# Patient Record
Sex: Male | Born: 1965 | Race: White | Hispanic: No | Marital: Single | Smoking: Never smoker
Health system: Southern US, Community
[De-identification: ages and names within clinical notes are randomized; demographics above are authoritative.]

## PROBLEM LIST (undated history)

## (undated) DIAGNOSIS — E7800 Pure hypercholesterolemia, unspecified: Secondary | ICD-10-CM

## (undated) DIAGNOSIS — K259 Gastric ulcer, unspecified as acute or chronic, without hemorrhage or perforation: Secondary | ICD-10-CM

## (undated) DIAGNOSIS — T39395A Adverse effect of other nonsteroidal anti-inflammatory drugs [NSAID], initial encounter: Secondary | ICD-10-CM

## (undated) DIAGNOSIS — R112 Nausea with vomiting, unspecified: Secondary | ICD-10-CM

## (undated) DIAGNOSIS — Z9889 Other specified postprocedural states: Secondary | ICD-10-CM

## (undated) HISTORY — PX: BACK SURGERY: SHX140

## (undated) HISTORY — PX: HERNIA REPAIR: SHX51

---

## 2006-04-10 HISTORY — PX: MICRODISCECTOMY LUMBAR: SUR864

## 2006-09-24 ENCOUNTER — Ambulatory Visit (HOSPITAL_COMMUNITY): Admission: RE | Admit: 2006-09-24 | Discharge: 2006-09-24 | Payer: Self-pay | Admitting: Neurosurgery

## 2008-08-04 IMAGING — CR DG LUMBAR SPINE 2-3V
1 series · 1 of 1 positions shown · non-contrast
Comparison: none

CLINICAL DATA: L4-5 microdiskectomy.
 LUMBAR SPINE ? 2 VIEWS:
 Lateral view of the lumbar spine ? 09/24/06.

[view not recorded]
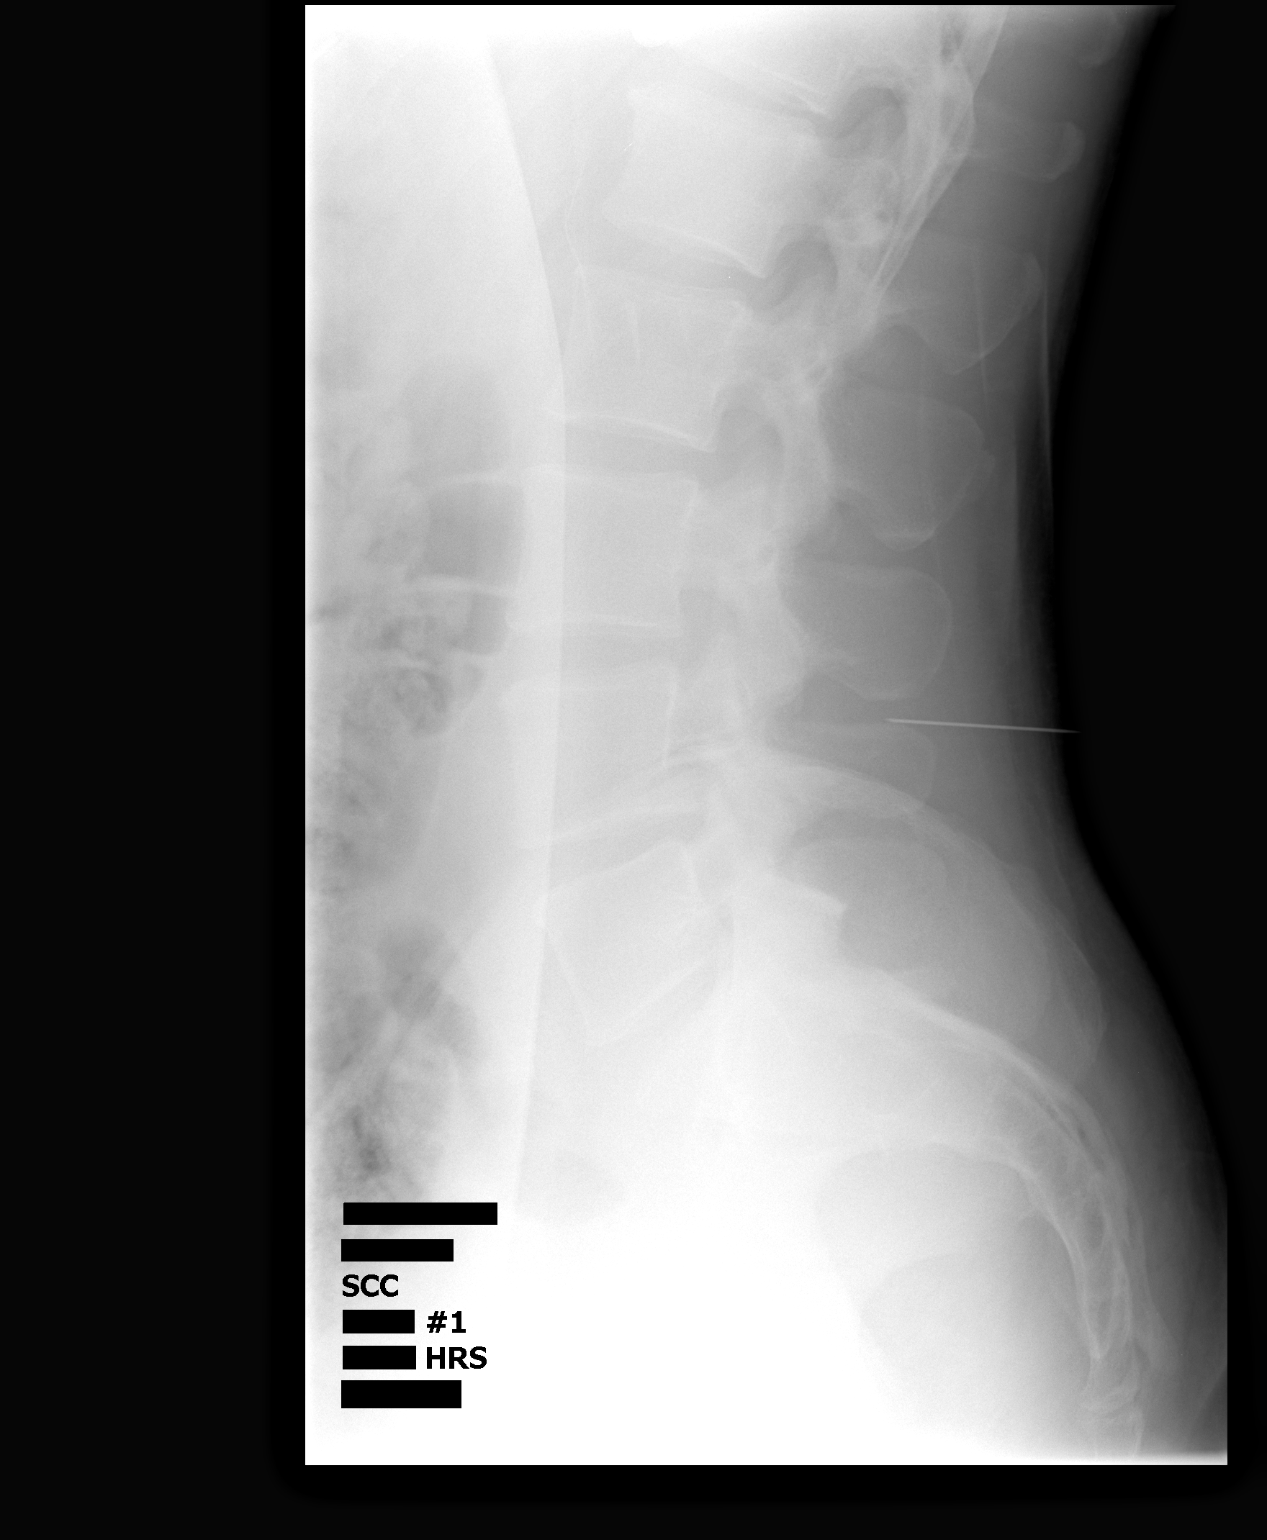

[1 of 1 positions shown; findings below may reference images not displayed]

FINDINGS: Single lateral views of the lumbar spine are provided.  Film labeled #1 at 2265 hours demonstrates a metallic probe at the level of the L4 pedicles.  Film labeled #2 at 9956 hours shows tissue spreaders in place with a blunt metallic probe directed towards the L4-5 level.
IMPRESSION: As discussed above.

## 2009-04-10 HISTORY — PX: EXCISIONAL HEMORRHOIDECTOMY: SHX1541

## 2010-08-23 NOTE — Op Note (Signed)
NAME:  Grant Duffy, Grant Duffy             ACCOUNT NO.:  [id number]   MEDICAL RECORD NO.:  [id number]          PATIENT TYPE:  AMB   LOCATION:  SDS                          FACILITY:  [facility]   PHYSICIAN:  Henry A. Pool, M.D.    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   PROCEDURE NOTE:  Left L4-5 laminotomy and microdiskectomy.   SURGEON:  Kathaleen Maser. Pool, M.D.   ASSISTANT:  Donalee Citrin, M.D.   ANESTHESIA:  General oral endotracheal.   INDICATIONS:  Mr. Weckerly is a 45 year old male with history of back  and left lower extremity pain failing conservative management, workup  demonstrates evidence of a large paracentral disk herniation off to the  left side at L4-5 with moderately severe spinal stenosis and compression  of thecal sac and L5 nerve roots bilaterally, left greater than right.  The patient counseled as to options.  Decided proceed with a left-sided  L4-5 laminotomy microdiskectomy in hopes improving symptoms.   OPERATIVE NOTE:  The patient was taken to the operating room placed on  table supine position.  Adequate level anesthesia achieved, the patient  placed prone on Wilson frame, appropriately padded.  Patient lumbar  regions prepped and draped sterilely.  10 blade used to make linear skin  incision overlying the L4-5 interspace.  This carried sharply in the  midline.  Subperiosteal dissection performed, exposing lamina and facet  joints at L4 and L5 on the left side.  Deep self-retaining retractor was  placed.  Intraoperative x-rays taken, level was confirmed.  Laminotomy  then performed using high-speed drill and Kerrison rongeurs remove  inferior aspect lamina of L4, medial aspect of L4-5 facet joint,  superior rim of  L5-1.  Ligamentum flavum elevated resected piecemeal  fashion using Kerrison rongeurs.  Underlying thecal sac and exiting L5  nerve root were identified.  A microscope brought to the field for  microdissection of the right side L5 nerve root underlying disk  herniation.  Epidural venous plexus coagulated cut.  Thecal sac and  nerve root were mobilized tracked towards midline.  Disk space was then  incised 15 blade in rectangular fashion.  A wide space clean-out was  achieved using pituitary rongeurs, upward angled pituitary rongeurs and  Epstein curettes.  All elements disk herniation completely resected.  All loose or obviously degenerative disk material removed from  interspace.  At this point a very thorough diskectomy was achieved.  All  elements of the disk herniation completely resected.  There is no injury  to thecal sac or nerve roots.  There is no residual compression upon  thecal sac or nerve roots.  Wound was then irrigated with antibiotic  solution.  Gelfoam was placed topically for hemostasis, found be good.  Microscope retractor system were removed.  Hemostasis  muscle achieved with electrocautery. Wound was then closed in layers  Vicryl suture.  Steri-Strips sterile  dressing were applied.  There no  complications.  The patient tolerated procedure well and he returns  recovery postoperatively.           ______________________________  Kathaleen Maser Pool, M.D.     HAP/MEDQ  D:  09/24/2006  T:  09/24/2006  Job:  [id number]

## 2011-01-26 LAB — CBC
Hemoglobin: 16
MCHC: 34.3
Platelets: 238
RDW: 13.2

## 2011-01-26 LAB — DIFFERENTIAL
Basophils Absolute: 0
Basophils Relative: 1
Lymphocytes Relative: 28
Monocytes Absolute: 0.6
Neutro Abs: 4.6
Neutrophils Relative %: 56

## 2011-01-26 LAB — TYPE AND SCREEN
ABO/RH(D): O POS
Antibody Screen: NEGATIVE

## 2011-01-26 LAB — ABO/RH: ABO/RH(D): O POS

## 2011-04-11 DIAGNOSIS — K259 Gastric ulcer, unspecified as acute or chronic, without hemorrhage or perforation: Secondary | ICD-10-CM

## 2011-04-11 HISTORY — PX: UMBILICAL HERNIA REPAIR: SHX196

## 2011-04-11 HISTORY — DX: Gastric ulcer, unspecified as acute or chronic, without hemorrhage or perforation: K25.9

## 2013-07-28 DIAGNOSIS — Z8719 Personal history of other diseases of the digestive system: Secondary | ICD-10-CM | POA: Insufficient documentation

## 2013-07-28 DIAGNOSIS — K219 Gastro-esophageal reflux disease without esophagitis: Secondary | ICD-10-CM | POA: Insufficient documentation

## 2013-07-28 DIAGNOSIS — Z9889 Other specified postprocedural states: Secondary | ICD-10-CM

## 2015-03-09 ENCOUNTER — Encounter (HOSPITAL_COMMUNITY): Payer: Self-pay

## 2015-03-09 ENCOUNTER — Other Ambulatory Visit: Payer: Self-pay | Admitting: General Surgery

## 2015-03-09 ENCOUNTER — Encounter (HOSPITAL_COMMUNITY)
Admission: RE | Admit: 2015-03-09 | Discharge: 2015-03-09 | Disposition: A | Discharge status: Home / Self Care | Payer: BLUE CROSS/BLUE SHIELD | Source: Ambulatory Visit | Attending: General Surgery | Admitting: General Surgery

## 2015-03-09 DIAGNOSIS — Z01812 Encounter for preprocedural laboratory examination: Secondary | ICD-10-CM | POA: Insufficient documentation

## 2015-03-09 DIAGNOSIS — K432 Incisional hernia without obstruction or gangrene: Secondary | ICD-10-CM | POA: Diagnosis not present

## 2015-03-09 HISTORY — DX: Other specified postprocedural states: Z98.890

## 2015-03-09 HISTORY — DX: Nausea with vomiting, unspecified: R11.2

## 2015-03-09 LAB — BASIC METABOLIC PANEL
Anion gap: 9 (ref 5–15)
BUN: 16 mg/dL (ref 6–20)
CALCIUM: 9.7 mg/dL (ref 8.9–10.3)
CO2: 23 mmol/L (ref 22–32)
CREATININE: 1.04 mg/dL (ref 0.61–1.24)
Chloride: 106 mmol/L (ref 101–111)
GFR calc non Af Amer: >60 mL/min (ref >60)
Glucose, Bld: 89 mg/dL (ref 65–99)
Potassium: 4 mmol/L (ref 3.5–5.1)
SODIUM: 138 mmol/L (ref 135–145)

## 2015-03-09 LAB — CBC WITH DIFFERENTIAL/PLATELET
BASOS PCT: 1 %
Basophils Absolute: 0.1 10*3/uL (ref 0.0–0.1)
EOS ABS: 0.1 10*3/uL (ref 0.0–0.7)
EOS PCT: 1 %
HCT: 46.9 % (ref 39.0–52.0)
HEMOGLOBIN: 17 g/dL (ref 13.0–17.0)
Lymphocytes Relative: 34 %
Lymphs Abs: 3.6 10*3/uL (ref 0.7–4.0)
MCH: 30 pg (ref 26.0–34.0)
MCHC: 36.2 g/dL — AB (ref 30.0–36.0)
MCV: 82.7 fL (ref 78.0–100.0)
MONOS PCT: 6 %
Monocytes Absolute: 0.6 10*3/uL (ref 0.1–1.0)
NEUTROS PCT: 58 %
Neutro Abs: 6.2 10*3/uL (ref 1.7–7.7)
PLATELETS: 221 10*3/uL (ref 150–400)
RBC: 5.67 MIL/uL (ref 4.22–5.81)
RDW: 12.5 % (ref 11.5–15.5)
WBC: 10.5 10*3/uL (ref 4.0–10.5)

## 2015-03-09 LAB — URINE MICROSCOPIC-ADD ON
Bacteria, UA: NONE SEEN
WBC UA: NONE SEEN WBC/hpf (ref 0–5)

## 2015-03-09 LAB — URINALYSIS, ROUTINE W REFLEX MICROSCOPIC
BILIRUBIN URINE: NEGATIVE
Glucose, UA: NEGATIVE mg/dL
Ketones, ur: NEGATIVE mg/dL
Leukocytes, UA: NEGATIVE
NITRITE: NEGATIVE
PROTEIN: NEGATIVE mg/dL
SPECIFIC GRAVITY, URINE: 1.01 (ref 1.005–1.030)
pH: 5.5 (ref 5.0–8.0)

## 2015-03-09 NOTE — Pre-Procedure Instructions (Signed)
Grant Duffy  03/09/2015     No Pharmacies Listed   Your procedure is scheduled on  Monday  03/15/15  [facility] at 530 A.M.  Call this number if you have problems the morning of surgery:  [phone]   Remember:  Do not eat food or drink liquids after midnight.  Take these medicines the morning of surgery with A SIP OF WATER    (STOP ASPIRIN, COUMADIN, PLAVIX, EFFIENT, HERBAL MEDICINES, ibuprofen, krill oil)   Do not wear jewelry, make-up or nail polish.  Do not wear lotions, powders, or perfumes.  You may wear deodorant.  Do not shave 48 hours prior to surgery.  Men may shave face and neck.  Do not bring valuables to the hospital.  [facility] is not responsible for any belongings or valuables.  Contacts, dentures or bridgework may not be worn into surgery.  Leave your suitcase in the car.  After surgery it may be brought to your room.  For patients admitted to the hospital, discharge time will be determined by your treatment team.  Patients discharged the day of surgery will not be allowed to drive home.   Name and phone number of your driver:   Special instructions:  Grant Duffy - Preparing for Surgery  Before surgery, you can play an important role.  Because skin is not sterile, your skin needs to be as free of germs as possible.  You can reduce the number of germs on you skin by washing with CHG (chlorahexidine gluconate) soap before surgery.  CHG is an antiseptic cleaner which kills germs and bonds with the skin to continue killing germs even after washing.  Please DO NOT use if you have an allergy to CHG or antibacterial soaps.  If your skin becomes reddened/irritated stop using the CHG and inform your nurse when you arrive at Short Stay.  Do not shave (including legs and underarms) for at least 48 hours prior to the first CHG shower.  You may shave your face.  Please follow these instructions carefully:   1.  Shower with CHG Soap  the night before surgery and the                                morning of Surgery.  2.  If you choose to wash your hair, wash your hair first as usual with your       normal shampoo.  3.  After you shampoo, rinse your hair and body thoroughly to remove the                      Shampoo.  4.  Use CHG as you would any other liquid soap.  You can apply chg directly       to the skin and wash gently with scrungie or a clean washcloth.  5.  Apply the CHG Soap to your body ONLY FROM THE NECK DOWN.        Do not use on open wounds or open sores.  Avoid contact with your eyes,       ears, mouth and genitals (private parts).  Wash genitals (private parts)       with your normal soap.  6.  Wash thoroughly, paying special attention to the area where your surgery        will be performed.  7.  Thoroughly rinse your  body with warm water from the neck down.  8.  DO NOT shower/wash with your normal soap after using and rinsing off       the CHG Soap.  9.  Pat yourself dry with a clean towel.            10.  Wear clean pajamas.            11.  Place clean sheets on your bed the night of your first shower and do not        sleep with pets.  Day of Surgery  Do not apply any lotions/deoderants the morning of surgery.  Please wear clean clothes to the hospital/surgery center.    Please read over the following fact sheets that you were given. Pain Booklet, Coughing and Deep Breathing and Surgical Site Infection Prevention

## 2015-03-14 MED ORDER — CEFAZOLIN SODIUM-DEXTROSE 2-3 GM-% IV SOLR
2.0000 g | INTRAVENOUS | Status: AC
  Administered 2015-03-15: 2 g via INTRAVENOUS
  Filled 2015-03-14: qty 50

## 2015-03-15 ENCOUNTER — Ambulatory Visit (HOSPITAL_COMMUNITY): Payer: BLUE CROSS/BLUE SHIELD | Admitting: Anesthesiology

## 2015-03-15 ENCOUNTER — Observation Stay (HOSPITAL_COMMUNITY)
Admission: RE | Admit: 2015-03-15 | Discharge: 2015-03-16 | Disposition: A | Discharge status: Home / Self Care | Payer: BLUE CROSS/BLUE SHIELD | Source: Ambulatory Visit | Attending: General Surgery | Admitting: General Surgery

## 2015-03-15 ENCOUNTER — Encounter (HOSPITAL_COMMUNITY): Payer: Self-pay | Admitting: Certified Registered Nurse Anesthetist

## 2015-03-15 ENCOUNTER — Encounter (HOSPITAL_COMMUNITY)
Admission: RE | Disposition: A | Discharge status: Home / Self Care | Payer: Self-pay | Source: Ambulatory Visit | Attending: General Surgery

## 2015-03-15 DIAGNOSIS — K432 Incisional hernia without obstruction or gangrene: Secondary | ICD-10-CM | POA: Diagnosis present

## 2015-03-15 DIAGNOSIS — K429 Umbilical hernia without obstruction or gangrene: Secondary | ICD-10-CM | POA: Diagnosis not present

## 2015-03-15 HISTORY — PX: INSERTION OF MESH: SHX5868

## 2015-03-15 HISTORY — DX: Adverse effect of other nonsteroidal anti-inflammatory drugs (NSAID), initial encounter: T39.395A

## 2015-03-15 HISTORY — DX: Pure hypercholesterolemia, unspecified: E78.00

## 2015-03-15 HISTORY — PX: LAPAROSCOPIC INCISIONAL / UMBILICAL / VENTRAL HERNIA REPAIR: SUR789

## 2015-03-15 HISTORY — PX: INCISIONAL HERNIA REPAIR: SHX193

## 2015-03-15 HISTORY — DX: Gastric ulcer, unspecified as acute or chronic, without hemorrhage or perforation: K25.9

## 2015-03-15 SURGERY — REPAIR, HERNIA, INCISIONAL, LAPAROSCOPIC
Anesthesia: General | Site: Abdomen

## 2015-03-15 MED ORDER — ROCURONIUM BROMIDE 50 MG/5ML IV SOLN
INTRAVENOUS | Status: AC
  Filled 2015-03-15: qty 1

## 2015-03-15 MED ORDER — SCOPOLAMINE 1 MG/3DAYS TD PT72
1.0000 | MEDICATED_PATCH | TRANSDERMAL | Status: DC
  Administered 2015-03-15: 1.5 mg via TRANSDERMAL
  Filled 2015-03-15: qty 1

## 2015-03-15 MED ORDER — HEPARIN SODIUM (PORCINE) 5000 UNIT/ML IJ SOLN
5000.0000 [IU] | Freq: Once | INTRAMUSCULAR | Status: AC
  Administered 2015-03-15: 5000 [IU] via SUBCUTANEOUS
  Filled 2015-03-15: qty 1

## 2015-03-15 MED ORDER — OXYCODONE HCL 5 MG PO TABS
ORAL_TABLET | ORAL | Status: AC
  Filled 2015-03-15: qty 2

## 2015-03-15 MED ORDER — PROPOFOL 10 MG/ML IV BOLUS
INTRAVENOUS | Status: AC
  Filled 2015-03-15: qty 40

## 2015-03-15 MED ORDER — DEXAMETHASONE SODIUM PHOSPHATE 10 MG/ML IJ SOLN
INTRAMUSCULAR | Status: DC | PRN
  Administered 2015-03-15: 10 mg via INTRAVENOUS

## 2015-03-15 MED ORDER — LIDOCAINE HCL (CARDIAC) 20 MG/ML IV SOLN
INTRAVENOUS | Status: DC | PRN
  Administered 2015-03-15: 20 mg via INTRAVENOUS

## 2015-03-15 MED ORDER — SODIUM CHLORIDE 0.9 % IV SOLN
INTRAVENOUS | Status: DC
Start: 2015-03-15 — End: 2015-03-16
  Administered 2015-03-15 (×2): via INTRAVENOUS

## 2015-03-15 MED ORDER — ENOXAPARIN SODIUM 40 MG/0.4ML ~~LOC~~ SOLN
40.0000 mg | SUBCUTANEOUS | Status: DC
  Administered 2015-03-16: 40 mg via SUBCUTANEOUS
  Filled 2015-03-15: qty 0.4

## 2015-03-15 MED ORDER — ONDANSETRON HCL 4 MG/2ML IJ SOLN
INTRAMUSCULAR | Status: DC | PRN
  Administered 2015-03-15: 4 mg via INTRAVENOUS

## 2015-03-15 MED ORDER — EPHEDRINE SULFATE 50 MG/ML IJ SOLN
INTRAMUSCULAR | Status: AC
  Filled 2015-03-15: qty 1

## 2015-03-15 MED ORDER — SCOPOLAMINE 1 MG/3DAYS TD PT72
MEDICATED_PATCH | TRANSDERMAL | Status: AC
  Filled 2015-03-15: qty 1

## 2015-03-15 MED ORDER — ONDANSETRON HCL 4 MG/2ML IJ SOLN
INTRAMUSCULAR | Status: AC
  Filled 2015-03-15: qty 2

## 2015-03-15 MED ORDER — LACTATED RINGERS IV SOLN
INTRAVENOUS | Status: DC | PRN
  Administered 2015-03-15 (×2): via INTRAVENOUS

## 2015-03-15 MED ORDER — ONDANSETRON HCL 4 MG/2ML IJ SOLN
4.0000 mg | Freq: Four times a day (QID) | INTRAMUSCULAR | Status: DC | PRN
  Administered 2015-03-15: 4 mg via INTRAVENOUS
  Filled 2015-03-15: qty 2

## 2015-03-15 MED ORDER — NEOSTIGMINE METHYLSULFATE 10 MG/10ML IV SOLN
INTRAVENOUS | Status: AC
  Filled 2015-03-15: qty 1

## 2015-03-15 MED ORDER — ONDANSETRON 4 MG PO TBDP: 4.0000 mg | ORAL_TABLET | Freq: Four times a day (QID) | ORAL | Status: DC | PRN

## 2015-03-15 MED ORDER — ARTIFICIAL TEARS OP OINT
TOPICAL_OINTMENT | OPHTHALMIC | Status: AC
  Filled 2015-03-15: qty 3.5

## 2015-03-15 MED ORDER — KETOROLAC TROMETHAMINE 15 MG/ML IJ SOLN
15.0000 mg | Freq: Four times a day (QID) | INTRAMUSCULAR | Status: DC | PRN
  Administered 2015-03-15 (×2): 15 mg via INTRAVENOUS
  Filled 2015-03-15: qty 1

## 2015-03-15 MED ORDER — GLYCOPYRROLATE 0.2 MG/ML IJ SOLN
INTRAMUSCULAR | Status: AC
  Filled 2015-03-15: qty 3

## 2015-03-15 MED ORDER — MIDAZOLAM HCL 2 MG/2ML IJ SOLN
INTRAMUSCULAR | Status: AC
  Filled 2015-03-15: qty 2

## 2015-03-15 MED ORDER — BUPIVACAINE-EPINEPHRINE (PF) 0.25% -1:200000 IJ SOLN
INTRAMUSCULAR | Status: AC
  Filled 2015-03-15: qty 30

## 2015-03-15 MED ORDER — METHOCARBAMOL 500 MG PO TABS
500.0000 mg | ORAL_TABLET | Freq: Four times a day (QID) | ORAL | Status: DC | PRN
  Administered 2015-03-15 – 2015-03-16 (×3): 500 mg via ORAL
  Filled 2015-03-15 (×3): qty 1

## 2015-03-15 MED ORDER — SUCCINYLCHOLINE CHLORIDE 20 MG/ML IJ SOLN
INTRAMUSCULAR | Status: AC
  Filled 2015-03-15: qty 1

## 2015-03-15 MED ORDER — FENTANYL CITRATE (PF) 100 MCG/2ML IJ SOLN
INTRAMUSCULAR | Status: DC | PRN
  Administered 2015-03-15 (×2): 50 ug via INTRAVENOUS
  Administered 2015-03-15: 100 ug via INTRAVENOUS
  Administered 2015-03-15: 50 ug via INTRAVENOUS

## 2015-03-15 MED ORDER — OXYCODONE HCL 5 MG PO TABS
5.0000 mg | ORAL_TABLET | ORAL | Status: DC | PRN
  Administered 2015-03-15 – 2015-03-16 (×5): 10 mg via ORAL
  Filled 2015-03-15 (×4): qty 2

## 2015-03-15 MED ORDER — ARTIFICIAL TEARS OP OINT
TOPICAL_OINTMENT | OPHTHALMIC | Status: DC | PRN
  Administered 2015-03-15: 1 via OPHTHALMIC

## 2015-03-15 MED ORDER — SODIUM CHLORIDE 0.9 % IJ SOLN
INTRAMUSCULAR | Status: AC
  Filled 2015-03-15: qty 10

## 2015-03-15 MED ORDER — HYDROMORPHONE HCL 1 MG/ML IJ SOLN
0.2500 mg | INTRAMUSCULAR | Status: DC | PRN
  Administered 2015-03-15 (×4): 0.5 mg via INTRAVENOUS

## 2015-03-15 MED ORDER — SUGAMMADEX SODIUM 200 MG/2ML IV SOLN
INTRAVENOUS | Status: AC
  Filled 2015-03-15: qty 2

## 2015-03-15 MED ORDER — ACETAMINOPHEN 325 MG PO TABS: 650.0000 mg | ORAL_TABLET | Freq: Four times a day (QID) | ORAL | Status: DC | PRN

## 2015-03-15 MED ORDER — MORPHINE SULFATE (PF) 2 MG/ML IV SOLN
2.0000 mg | INTRAVENOUS | Status: DC | PRN
  Administered 2015-03-15: 2 mg via INTRAVENOUS
  Filled 2015-03-15: qty 1

## 2015-03-15 MED ORDER — BUPIVACAINE-EPINEPHRINE 0.25% -1:200000 IJ SOLN
INTRAMUSCULAR | Status: DC | PRN
  Administered 2015-03-15: 7 mL

## 2015-03-15 MED ORDER — 0.9 % SODIUM CHLORIDE (POUR BTL) OPTIME
TOPICAL | Status: DC | PRN
  Administered 2015-03-15: 1000 mL

## 2015-03-15 MED ORDER — ACETAMINOPHEN 650 MG RE SUPP: 650.0000 mg | Freq: Four times a day (QID) | RECTAL | Status: DC | PRN

## 2015-03-15 MED ORDER — PROPOFOL 10 MG/ML IV BOLUS
INTRAVENOUS | Status: DC | PRN
  Administered 2015-03-15: 160 mg via INTRAVENOUS

## 2015-03-15 MED ORDER — ROCURONIUM BROMIDE 100 MG/10ML IV SOLN
INTRAVENOUS | Status: DC | PRN
  Administered 2015-03-15 (×2): 10 mg via INTRAVENOUS
  Administered 2015-03-15: 40 mg via INTRAVENOUS
  Administered 2015-03-15: 10 mg via INTRAVENOUS

## 2015-03-15 MED ORDER — SUGAMMADEX SODIUM 200 MG/2ML IV SOLN
INTRAVENOUS | Status: DC | PRN
  Administered 2015-03-15: 200 mg via INTRAVENOUS

## 2015-03-15 MED ORDER — MIDAZOLAM HCL 5 MG/5ML IJ SOLN
INTRAMUSCULAR | Status: DC | PRN
  Administered 2015-03-15: 2 mg via INTRAVENOUS

## 2015-03-15 MED ORDER — FENTANYL CITRATE (PF) 250 MCG/5ML IJ SOLN
INTRAMUSCULAR | Status: AC
  Filled 2015-03-15: qty 5

## 2015-03-15 MED ORDER — PANTOPRAZOLE SODIUM 40 MG PO TBEC
40.0000 mg | DELAYED_RELEASE_TABLET | Freq: Every day | ORAL | Status: DC
  Filled 2015-03-15: qty 1

## 2015-03-15 MED ORDER — HYDROMORPHONE HCL 1 MG/ML IJ SOLN
INTRAMUSCULAR | Status: AC
  Administered 2015-03-15: 0.5 mg via INTRAVENOUS
  Filled 2015-03-15: qty 1

## 2015-03-15 MED ORDER — LIDOCAINE HCL (CARDIAC) 20 MG/ML IV SOLN
INTRAVENOUS | Status: AC
  Filled 2015-03-15: qty 5

## 2015-03-15 MED ORDER — KETOROLAC TROMETHAMINE 15 MG/ML IJ SOLN
INTRAMUSCULAR | Status: AC
  Filled 2015-03-15: qty 1

## 2015-03-15 SURGICAL SUPPLY — 59 items
ADH SKN CLS APL DERMABOND .7 (GAUZE/BANDAGES/DRESSINGS) ×1
APPLIER CLIP LOGIC TI 5 (MISCELLANEOUS) IMPLANT
APPLIER CLIP ROT 10 11.4 M/L (STAPLE)
APR CLP MED LRG 11.4X10 (STAPLE)
APR CLP MED LRG 33X5 (MISCELLANEOUS)
BINDER ABD UNIV 10 28-50 (GAUZE/BANDAGES/DRESSINGS) ×1 IMPLANT
BINDER ABDOM UNIV 10 (GAUZE/BANDAGES/DRESSINGS) ×3
BINDER ABDOMINAL 10 UNV 27-48 (MISCELLANEOUS) ×3 IMPLANT
BNDG GAUZE ELAST 4 BULKY (GAUZE/BANDAGES/DRESSINGS) IMPLANT
CANISTER SUCTION 2500CC (MISCELLANEOUS) IMPLANT
CHLORAPREP W/TINT 26ML (MISCELLANEOUS) ×3 IMPLANT
CLIP APPLIE ROT 10 11.4 M/L (STAPLE) IMPLANT
CLOSURE WOUND 1/2 X4 (GAUZE/BANDAGES/DRESSINGS) ×1
COVER SURGICAL LIGHT HANDLE (MISCELLANEOUS) ×3 IMPLANT
DERMABOND ADVANCED (GAUZE/BANDAGES/DRESSINGS) ×2
DERMABOND ADVANCED .7 DNX12 (GAUZE/BANDAGES/DRESSINGS) ×1 IMPLANT
DEVICE RELIATACK FIXATION (MISCELLANEOUS) ×3 IMPLANT
DEVICE SECURE STRAP 25 ABSORB (INSTRUMENTS) ×6 IMPLANT
DEVICE TROCAR PUNCTURE CLOSURE (ENDOMECHANICALS) ×3 IMPLANT
DRAPE INCISE IOBAN 66X45 STRL (DRAPES) ×3 IMPLANT
DRAPE LAPAROSCOPIC ABDOMINAL (DRAPES) ×3 IMPLANT
ELECT REM PT RETURN 9FT ADLT (ELECTROSURGICAL) ×3
ELECTRODE REM PT RTRN 9FT ADLT (ELECTROSURGICAL) ×1 IMPLANT
GLOVE BIO SURGEON STRL SZ7 (GLOVE) ×3 IMPLANT
GLOVE BIOGEL PI IND STRL 7.0 (GLOVE) ×1 IMPLANT
GLOVE BIOGEL PI IND STRL 7.5 (GLOVE) ×1 IMPLANT
GLOVE BIOGEL PI IND STRL 8 (GLOVE) ×1 IMPLANT
GLOVE BIOGEL PI INDICATOR 7.0 (GLOVE) ×2
GLOVE BIOGEL PI INDICATOR 7.5 (GLOVE) ×2
GLOVE BIOGEL PI INDICATOR 8 (GLOVE) ×2
GLOVE SURG SS PI 7.0 STRL IVOR (GLOVE) ×3 IMPLANT
GLOVE SURG SS PI 8.0 STRL IVOR (GLOVE) ×3 IMPLANT
GOWN STRL REUS W/ TWL LRG LVL3 (GOWN DISPOSABLE) ×3 IMPLANT
GOWN STRL REUS W/TWL LRG LVL3 (GOWN DISPOSABLE) ×9
KIT BASIN OR (CUSTOM PROCEDURE TRAY) ×3 IMPLANT
KIT ROOM TURNOVER OR (KITS) ×3 IMPLANT
MARKER SKIN DUAL TIP RULER LAB (MISCELLANEOUS) ×3 IMPLANT
MESH VENTRALIGHT ST 6IN CRC (Mesh General) ×3 IMPLANT
NEEDLE SPNL 22GX3.5 QUINCKE BK (NEEDLE) ×3 IMPLANT
NS IRRIG 1000ML POUR BTL (IV SOLUTION) ×3 IMPLANT
PAD ARMBOARD 7.5X6 YLW CONV (MISCELLANEOUS) ×6 IMPLANT
RELOAD RELIATACK 10 (MISCELLANEOUS) IMPLANT
RELOAD RELIATACK 5 (MISCELLANEOUS) IMPLANT
SCALPEL HARMONIC ACE (MISCELLANEOUS) IMPLANT
SCISSORS LAP 5X35 DISP (ENDOMECHANICALS) IMPLANT
SET IRRIG TUBING LAPAROSCOPIC (IRRIGATION / IRRIGATOR) IMPLANT
SLEEVE ADV FIXATION 5X100MM (TROCAR) ×3 IMPLANT
SLEEVE ENDOPATH XCEL 5M (ENDOMECHANICALS) ×3 IMPLANT
STRIP CLOSURE SKIN 1/2X4 (GAUZE/BANDAGES/DRESSINGS) ×2 IMPLANT
SUT MNCRL AB 4-0 PS2 18 (SUTURE) ×3 IMPLANT
SUT PROLENE 0 CT 1 CR/8 (SUTURE) ×3 IMPLANT
TOWEL OR 17X24 6PK STRL BLUE (TOWEL DISPOSABLE) IMPLANT
TOWEL OR 17X26 10 PK STRL BLUE (TOWEL DISPOSABLE) ×3 IMPLANT
TRAY FOLEY CATH 14FR (SET/KITS/TRAYS/PACK) ×3 IMPLANT
TRAY LAPAROSCOPIC MC (CUSTOM PROCEDURE TRAY) ×3 IMPLANT
TROCAR XCEL BLUNT TIP 100MML (ENDOMECHANICALS) IMPLANT
TROCAR XCEL NON-BLD 11X100MML (ENDOMECHANICALS) ×3 IMPLANT
TROCAR XCEL NON-BLD 5MMX100MML (ENDOMECHANICALS) ×3 IMPLANT
TUBING INSUFFLATION (TUBING) ×3 IMPLANT

## 2015-03-15 NOTE — Op Note (Signed)
Preoperative diagnosis: Recurrent umbilical hernia Postoperative diagnosis: Same as above Procedure: Laparoscopic umbilical hernia repair with 15 cm ventralight mesh Surgeon: Dr. Harden MoMatt Crystel Duffy Anesthesia: Gen. Estimated blood loss: Minimal Complications Drains: None Specimens: None Sponge and needle count was correct at completion Disposition to recovery in stable condition  Indications: This is a 49 year old male who underwent a primary umbilical hernia repair at an outside institution that has recurred. We discussed doing a laparoscopic repair with mesh given the failure of the primary repair.  Procedure: After informed consent was obtained the patient was taken to the operating room. He was given cefazolin. Sequential compression devices were on his legs. He was then placed under general anesthesia without complication. His abdomen was prepped and draped in the standard sterile surgical fashion. A surgical timeout was then performed.  I did place iobanoverlying his abdomen. I then infiltrated Marcaine in his left upper quadrant. I made an incision with an 11 blade. His stomach was evacuated with an orogastric tube. I then inserted a 5 mm Optiview trocar without any evidence of injury. The abdomen was insufflated to 15 mmHg pressure. I then inserted upon the trocar in the left lower quadrant as well as an11 in the mid abdomen. He was noted to have about a 2-1/2 cm hernia that there was some omentum in. I then removed this bluntly. I then placed a 15 cm ventralight ST mesh in place. I placed 0 Prolene sutures in all positions around the mesh. I then pulled these up to give adequate coverage of the defect with the suture passer. I then used to securestrap tacker to tack the edges. I did place another 5 mm trocar in the right side to attach the left side. This was all in good position. Hemostasis was observed. I then removed by 10 mm trocar and close this with a 0 Vicryl using the Endo Close device.  The remaining trocars were removed and the abdomen was desufflated. I then closed with 4-0 Monocryl. Glue was used onall the incisions. Steri-Strips were placed. An abdominal binder was placed He tolerated this well was extubated and transferred to the recovery room in stable condition.

## 2015-03-15 NOTE — Transfer of Care (Signed)
Immediate Anesthesia Transfer of Care Note  Patient: Grant Duffy  Procedure(s) Performed: Procedure(s): LAPAROSCOPIC INCISIONAL HERNIA WITH MESH (N/A) INSERTION OF MESH (N/A)  Patient Location: PACU  Anesthesia Type:General  Level of Consciousness: awake, alert , oriented and patient cooperative  Airway & Oxygen Therapy: Patient Spontanous Breathing and Patient connected to nasal cannula oxygen  Post-op Assessment: Report given to RN, Post -op Vital signs reviewed and stable and Patient moving all extremities  Post vital signs: Reviewed and stable  Last Vitals:  BP 142/98 HR 92 RR 13 SpO2 95% on 2L  Resting comfortably, maintains good airway  Complications: No apparent anesthesia complications

## 2015-03-15 NOTE — Anesthesia Procedure Notes (Addendum)
Procedure Name: Intubation Date/Time: 03/15/2015 7:32 AM Performed by: Roney MansSMITH, Junia Nygren P Pre-anesthesia Checklist: Patient identified, Timeout performed and Emergency Drugs available Oxygen Delivery Method: Circle system utilized Preoxygenation: Pre-oxygenation with 100% oxygen Intubation Type: IV induction Ventilation: Mask ventilation without difficulty Laryngoscope Size: Mac and 4 Grade View: Grade I Tube type: Oral Tube size: 7.5 mm Number of attempts: 2 (pls see note below) Airway Equipment and Method: Stylet Placement Confirmation: ETT inserted through vocal cords under direct vision,  breath sounds checked- equal and bilateral and positive ETCO2 Secured at: 23 cm Tube secured with: Tape Dental Injury: Injury to lip  Comments: SIVI by Dr Noreene LarssonJoslin. MV and DLx1 with Mac 4 by Dr Gala RomneyBensimhon resulting in esophageal intubation.  Immediately recognized and removed.  DLx1 with Mac 4 by Dr Noreene LarssonJoslin, ETT easily passed through VC, +etCO2, BS=B.  Stomach decompressed with OGT.  VSS stable throughout.

## 2015-03-15 NOTE — Interval H&P Note (Signed)
History and Physical Interval Note:  03/15/2015 7:09 AM  Grant Duffy  has presented today for surgery, with the diagnosis of RECURRENT INCISIONAL HERNIA  The various methods of treatment have been discussed with the patient and family. After consideration of risks, benefits and other options for treatment, the patient has consented to  Procedure(s): LAPAROSCOPIC INCISIONAL HERNIA WITH MESH (N/A) as a surgical intervention .  The patient's history has been reviewed, patient examined, no change in status, stable for surgery.  I have reviewed the patient's chart and labs.  Questions were answered to the patient's satisfaction.     [name]

## 2015-03-15 NOTE — Anesthesia Preprocedure Evaluation (Addendum)
Anesthesia Evaluation   Patient awake    History of Anesthesia Complications (+) PONV  Airway Mallampati: I  TM Distance: >3 FB Neck ROM: Full    Dental  (+) Teeth Intact, Dental Advisory Given   Pulmonary    Pulmonary exam normal breath sounds clear to auscultation       Cardiovascular Normal cardiovascular exam Rhythm:Regular Rate:Normal     Neuro/Psych    GI/Hepatic   Endo/Other    Renal/GU      Musculoskeletal   Abdominal   Peds  Hematology   Anesthesia Other Findings   Reproductive/Obstetrics                            Anesthesia Physical Anesthesia Plan  ASA: II  Anesthesia Plan: General   Post-op Pain Management:    Induction: Intravenous  Airway Management Planned: Oral ETT  Additional Equipment:   Intra-op Plan:   Post-operative Plan: Extubation in OR  Informed Consent: I have reviewed the patients History and Physical, chart, labs and discussed the procedure including the risks, benefits and alternatives for the proposed anesthesia with the patient or authorized representative who has indicated his/her understanding and acceptance.   Dental advisory given  Plan Discussed with: CRNA  Anesthesia Plan Comments:         Anesthesia Quick Evaluation

## 2015-03-15 NOTE — Discharge Instructions (Signed)
[facility] LAPAROSCOPIC SURGERY: POST OP INSTRUCTIONS  Always review your discharge instruction sheet given to you by the facility where your surgery was performed. IF YOU HAVE DISABILITY OR FAMILY LEAVE FORMS, YOU MUST BRING THEM TO THE OFFICE FOR PROCESSING.   DO NOT GIVE THEM TO YOUR DOCTOR.  1. A prescription for pain medication may be given to you upon discharge.  Take your pain medication as prescribed, if needed.  If narcotic pain medicine is not needed, then you may take acetaminophen (Tylenol), naprosyn (Alleve), or ibuprofen (Advil) as needed. 2. Take your usually prescribed medications unless otherwise directed. 3. If you need a refill on your pain medication, please contact your pharmacy.  They will contact our office to request authorization. Prescriptions will not be filled after 5pm or on week-ends. 4. You should follow a light diet the first few days after arrival home, such as soup and crackers, etc.  Be sure to include lots of fluids daily. 5. Most patients will experience some swelling and bruising in the area of the incisions.  Ice packs will help.  Swelling and bruising can take several days to resolve.  6. It is common to experience some constipation if taking pain medication after surgery.  Increasing fluid intake and taking a stool softener (such as Colace) will usually help or prevent this problem from occurring.  A mild laxative (Milk of Magnesia or Miralax) should be taken according to package instructions if there are no bowel movements after 48 hours. 7. Unless discharge instructions indicate otherwise, you may remove your bandages 48 hours after surgery, and you may shower at that time.  You may have steri-strips (small skin tapes) in place directly over the incision.  These strips should be left on the skin for 7-10 days.  If your surgeon used skin glue on the incision, you may shower in 24 hours.  The glue will flake  off over the next 2-3 weeks.  Any sutures or staples will be removed at the office during your follow-up visit. 8. ACTIVITIES:  You may resume regular (light) daily activities beginning the next day--such as daily self-care, walking, climbing stairs--gradually increasing activities as tolerated.  You may have sexual intercourse when it is comfortable.  Refrain from any heavy lifting or straining until approved by your doctor. a. You may drive when you are no longer taking prescription pain medication, you can comfortably wear a seatbelt, and you can safely maneuver your car and apply brakes. b. RETURN TO WORK:  __________________________________________________________ 9. You should see your doctor in the office for a follow-up appointment approximately 2-3 weeks after your surgery.  Make sure that you call for this appointment within a day or two after you arrive home to insure a convenient appointment time. 10. OTHER INSTRUCTIONS: __________________________________________________________________________________________________________________________ __________________________________________________________________________________________________________________________ WHEN TO CALL YOUR DOCTOR: 1. Fever over 101.0 2. Inability to urinate 3. Continued bleeding from incision. 4. Increased pain, redness, or drainage from the incision. 5. Increasing abdominal pain  The clinic staff is available to answer your questions during regular business hours.  Please don't hesitate to call and ask to speak to one of the nurses for clinical concerns.  If you have a medical emergency, go to the nearest emergency room or call 911.  A surgeon from [facility] is always on call at the hospital. [address], [address], [place], [place]  [address] ? [address] [address], [place], [place]   [address] [phone] ? [phone] ? [phone] Web site: [email or web address]  

## 2015-03-15 NOTE — H&P (Signed)
   48 yom [place] He has since moved and would like to have care closer to home. His wife works as Engineer, manufacturingpractice manager for [employer]. He noted about a year ago that this recurred. He reduces this daily. there is no pain, no real issues. he would like to discuss a repair now. this has started popping out more and requiring more times to reduce   Other Problems  Hemorrhoids Umbilical Hernia Repair Back Pain Gastric Ulcer  Past Surgical History  Ventral / Umbilical Hernia Surgery Right. Spinal Surgery - Lower Back Hemorrhoidectomy  Allergies  No Known Drug Allergies  Medication History No Current Medications  Social History  No drug use Tobacco use Never smoker. Alcohol use Occasional alcohol use. Caffeine use Carbonated beverages.  Vitals Weight: 215 lb Height: 69in Body Surface Area: 2.13 m Body Mass Index: 31.75 kg/m  Pulse: 76 (Regular)  BP: 130/78 (Sitting, Left Arm, Standard) Physical Exam  General Mental Status-Alert. Orientation-Oriented X3. Abdomen Note: soft nt/nd 1.5 cm mildly tender umbliical incisional hernia  Assessment & Plan  INCISIONAL HERNIA (K43.2) Story: laparoscopic incisional hernia repair with mesh discussed indication for laparoscopic repair with mesh given failure before. risks,recovery and surgery discussed in detail with he and his wife.

## 2015-03-15 NOTE — Anesthesia Postprocedure Evaluation (Signed)
Anesthesia Post Note  Patient: Grant Duffy  Procedure(s) Performed: Procedure(s) (LRB): LAPAROSCOPIC INCISIONAL HERNIA WITH MESH (N/A) INSERTION OF MESH (N/A)  Patient location during evaluation: PACU Anesthesia Type: General Level of consciousness: awake and alert Pain management: pain level controlled Vital Signs Assessment: post-procedure vital signs reviewed and stable Respiratory status: spontaneous breathing, nonlabored ventilation, respiratory function stable and patient connected to nasal cannula oxygen Cardiovascular status: blood pressure returned to baseline and stable Postop Assessment: no signs of nausea or vomiting Anesthetic complications: no    Last Vitals:  Filed Vitals:   03/15/15 0945 03/15/15 1000  BP: 139/86 138/96  Pulse: 87 83  Temp: 36.7 C   Resp: 14 15    Last Pain:  Filed Vitals:   03/15/15 1012  PainSc: 7                  Brittan Mapel,W. EDMOND

## 2015-03-16 ENCOUNTER — Encounter (HOSPITAL_COMMUNITY): Payer: Self-pay | Admitting: General Surgery

## 2015-03-16 DIAGNOSIS — K429 Umbilical hernia without obstruction or gangrene: Secondary | ICD-10-CM | POA: Diagnosis not present

## 2015-03-16 MED ORDER — OXYCODONE HCL 5 MG PO TABS: 5.0000 mg | ORAL_TABLET | ORAL | Status: AC | PRN

## 2015-03-16 NOTE — Progress Notes (Signed)
Grant Duffy to be D/C'd home per MD order. Discussed with the patient and all questions fully answered.  VSS, Surgical lap sites clean, dry, intact with steri-strips in place.  Abdominal binder on.  IV catheter discontinued intact. Site without signs and symptoms of complications. Dressing and pressure applied.  An After Visit Summary was printed and given to the patient. Patient received pain medication prescription.Patient stated that Dr.Wakefield told him he would also write a prescription for Robaxin.  This was never ordered. Called [facility] and Dr.Wakefield is not available to be paged today. His nurse sent him a message in Epic and will follow up with patient tomorrow about prescription. Patient is agreeable to this.  D/c education completed with patient/family including follow up instructions, medication list, d/c activities limitations if indicated, with other d/c instructions as indicated by MD - patient able to verbalize understanding, all questions fully answered.   Patient instructed to return to ED, call 911, or call MD for any changes in condition.   Patient ambulated off unit with supervision and D/C home via private auto.

## 2015-03-16 NOTE — Discharge Summary (Signed)
Physician Discharge Summary  Patient ID: Grant Duffy MRN: [id number] DOB/AGE: 48/10/1965 48 y.o.  Admit date: 03/15/2015 Discharge date: 03/16/2015  Admission Diagnoses: Recurrent umbilical hernia  Discharge Diagnoses:  Active Problems:   Recurrent ventral hernia   Discharged Condition: good  Hospital Course: [age] yom s/p laparoscopic repair with mesh of recurrent umbilical hernia.  He is doing well today. He is tolerating diet, pain well controlled, ambulating and voiding. Will dc home.   Consults: None  Significant Diagnostic Studies: none  Treatments: surgery  Discharge Exam: Blood pressure 120/79, pulse 76, temperature 97.8 F (36.6 C), temperature source Oral, resp. rate 18, height 5\' 9"  (1.753 m), weight 102.649 kg (226 lb 4.8 oz), SpO2 96 %. GI: incisions without infection, approp tender  Disposition:      Medication List    TAKE these medications        ibuprofen 200 MG tablet  Commonly known as:  ADVIL,MOTRIN  Take 400 mg by mouth every 6 (six) hours as needed for mild pain.     KRILL OIL PO  Take 1 capsule by mouth daily.     multivitamin with minerals tablet  Take 1 tablet by mouth daily.     omeprazole 20 MG capsule  Commonly known as:  PRILOSEC  Take 20 mg by mouth daily as needed (acid reflux).     oxyCODONE 5 MG immediate release tablet  Commonly known as:  Oxy IR/ROXICODONE  Take 1-2 tablets (5-10 mg total) by mouth every 4 (four) hours as needed for moderate pain.           Follow-up Information    [facility]Alyssah Algeo, MD In 3 weeks.   Specialty:  General Surgery   Contact information:   [address] [place] [id number] [phone]       Signed: Emelia LoronWAKEFIELD,Alera Quevedo 03/16/2015, 6:42 AM

## 2017-08-30 ENCOUNTER — Ambulatory Visit: Payer: Self-pay | Admitting: Podiatry

## 2017-08-30 ENCOUNTER — Encounter

## 2017-09-04 ENCOUNTER — Ambulatory Visit: Payer: Self-pay | Admitting: Podiatry

## 2018-06-12 ENCOUNTER — Ambulatory Visit: Payer: 59 | Admitting: Podiatrist

## 2018-06-12 VITALS — BP 133/90 | HR 71

## 2018-06-12 DIAGNOSIS — M216X9 Other acquired deformities of unspecified foot: Secondary | ICD-10-CM | POA: Diagnosis not present

## 2018-06-12 NOTE — Progress Notes (Signed)
  Chief Complaint  Patient presents with  . Plantar Fasciitis    Pt not currently having a flare or any pain, just wants to discuss history of it.  . Callouses    Pt states plantar callouses, but not currently a problem, just wants exam.  . Foot Orthotics    Pt interested in orthotics.     HPI: Patient is 53 y.o. male who presents today for general foot maintenance and health exam.  He has had plantar fasciitis in the past which has resolved with the use of brooks running shoes and stretches.  He also has a callus the forms submet 5th met head left more than right and sometimes gets a callus on the heels bilateral   Review of Systems  DATA OBTAINED: from patient  GENERAL: Feels well no fevers, no fatigue, no changes in appetite SKIN: No itching, no rashes, no open wounds EYES: No eye pain,no redness, no discharge EARS: No earache,no ringing of ears, NOSE: No congestion, no drainage, no bleeding  MOUTH/THROAT: No mouth pain, No sore throat, No difficulty chewing or swallowing  RESPIRATORY: No cough, no wheezing, no SOB CARDIAC: No chest pain,no heart palpitations, GI: No abdominal pain, No Nausea, no vomiting, no diarrhea, no heartburn or no reflux  GU: No dysuria, no increased frequency or urgency MUSCULOSKELETAL: No unrelieved bone/joint pain,  NEUROLOGIC: Awake, alert, appropriate to situation, No change in mental status. PSYCHIATRIC: No overt anxiety or sadness.No behavior issue.      Physical Exam  GENERAL APPEARANCE: Alert, conversant. Appropriately groomed. No acute distress.  VASCULAR: Pedal pulses palpable DP and PT bilateral.  Capillary refill time is immediate to all digits,  Proximal to distal cooling it warm to warm.  Digital hair growth is present bilateral  NEUROLOGIC: sensation is intact epicritically and protectively to 5.07 monofilament at 5/5 sites bilateral.  Light touch is intact bilateral, vibratory sensation intact bilateral, achilles tendon reflex is intact  bilateral.  MUSCULOSKELETAL: acceptable muscle strength, tone and stability bilateral.  Intrinsic muscluature intact bilateral.  Range of motion at ankle and first MPJ is normal bilateral.  Mild pes cavus foot type noted with pressure placed on the lateral foot bilateral.  Mild plantarflexed fifth metatarsal heads present bilateral.   DERMATOLOGIC: skin is warm, supple, and dry.  No open lesions noted.  No interdigital maceration noted bilateral.  Slight callus formation bilateral fifth met heads L more than right.  No heel callus noted today. Digital nails normal.      Assessment   Pes valgus- mild Prominent fifth metatarsal heads bilateral with callus that forms History of plantar fasciitis bilateral   Plan  Discussed routine callus debridement at home and patient is comfortable continuing this care on his own.  Discussed the positive long term benefit of custom orthotics with a pocket accomodation ofor the fifth metatarsal head prominence.  We will find out benefit coverage and call regarding this.  He will be seen back prn.

## 2020-10-15 ENCOUNTER — Ambulatory Visit
Admission: RE | Admit: 2020-10-15 | Discharge: 2020-10-15 | Disposition: A | Discharge status: Home / Self Care | Payer: BLUE CROSS/BLUE SHIELD | Source: Ambulatory Visit | Attending: Sports Medicine | Admitting: Sports Medicine

## 2020-10-15 ENCOUNTER — Other Ambulatory Visit: Payer: Self-pay | Admitting: Sports Medicine

## 2020-10-15 DIAGNOSIS — M25552 Pain in left hip: Secondary | ICD-10-CM

## 2021-08-23 DIAGNOSIS — M1612 Unilateral primary osteoarthritis, left hip: Secondary | ICD-10-CM | POA: Diagnosis not present

## 2021-08-25 DIAGNOSIS — L570 Actinic keratosis: Secondary | ICD-10-CM | POA: Diagnosis not present

## 2021-08-25 DIAGNOSIS — D225 Melanocytic nevi of trunk: Secondary | ICD-10-CM | POA: Diagnosis not present

## 2021-08-25 DIAGNOSIS — D224 Melanocytic nevi of scalp and neck: Secondary | ICD-10-CM | POA: Diagnosis not present

## 2021-08-25 DIAGNOSIS — L814 Other melanin hyperpigmentation: Secondary | ICD-10-CM | POA: Diagnosis not present

## 2021-08-25 DIAGNOSIS — L821 Other seborrheic keratosis: Secondary | ICD-10-CM | POA: Diagnosis not present

## 2021-09-01 DIAGNOSIS — Z96642 Presence of left artificial hip joint: Secondary | ICD-10-CM | POA: Diagnosis not present

## 2021-09-01 DIAGNOSIS — M1611 Unilateral primary osteoarthritis, right hip: Secondary | ICD-10-CM | POA: Diagnosis not present

## 2021-09-01 DIAGNOSIS — M25652 Stiffness of left hip, not elsewhere classified: Secondary | ICD-10-CM | POA: Diagnosis not present

## 2021-09-01 DIAGNOSIS — R262 Difficulty in walking, not elsewhere classified: Secondary | ICD-10-CM | POA: Diagnosis not present

## 2021-09-22 DIAGNOSIS — M1612 Unilateral primary osteoarthritis, left hip: Secondary | ICD-10-CM | POA: Diagnosis not present

## 2021-09-22 DIAGNOSIS — Z008 Encounter for other general examination: Secondary | ICD-10-CM | POA: Diagnosis not present

## 2021-09-22 DIAGNOSIS — Z01818 Encounter for other preprocedural examination: Secondary | ICD-10-CM | POA: Diagnosis not present

## 2021-09-22 DIAGNOSIS — R030 Elevated blood-pressure reading, without diagnosis of hypertension: Secondary | ICD-10-CM | POA: Diagnosis not present

## 2021-09-22 DIAGNOSIS — E668 Other obesity: Secondary | ICD-10-CM | POA: Diagnosis not present

## 2021-10-04 DIAGNOSIS — M1612 Unilateral primary osteoarthritis, left hip: Secondary | ICD-10-CM | POA: Diagnosis not present

## 2021-10-12 DIAGNOSIS — M1612 Unilateral primary osteoarthritis, left hip: Secondary | ICD-10-CM | POA: Diagnosis not present

## 2022-01-04 DIAGNOSIS — Z125 Encounter for screening for malignant neoplasm of prostate: Secondary | ICD-10-CM | POA: Diagnosis not present

## 2022-01-04 DIAGNOSIS — Z1329 Encounter for screening for other suspected endocrine disorder: Secondary | ICD-10-CM | POA: Diagnosis not present

## 2022-01-04 DIAGNOSIS — Z131 Encounter for screening for diabetes mellitus: Secondary | ICD-10-CM | POA: Diagnosis not present

## 2022-01-04 DIAGNOSIS — Z13220 Encounter for screening for lipoid disorders: Secondary | ICD-10-CM | POA: Diagnosis not present

## 2022-01-04 DIAGNOSIS — Z0000 Encounter for general adult medical examination without abnormal findings: Secondary | ICD-10-CM | POA: Diagnosis not present

## 2022-01-04 DIAGNOSIS — Z23 Encounter for immunization: Secondary | ICD-10-CM | POA: Diagnosis not present

## 2022-01-16 DIAGNOSIS — Z96642 Presence of left artificial hip joint: Secondary | ICD-10-CM | POA: Diagnosis not present

## 2022-01-21 ENCOUNTER — Emergency Department (HOSPITAL_BASED_OUTPATIENT_CLINIC_OR_DEPARTMENT_OTHER)
Admission: EM | Admit: 2022-01-21 | Discharge: 2022-01-22 | Disposition: A | Discharge status: Home / Self Care | Payer: BC Managed Care – PPO | Attending: Emergency Medicine | Admitting: Emergency Medicine

## 2022-01-21 ENCOUNTER — Other Ambulatory Visit: Payer: Self-pay

## 2022-01-21 DIAGNOSIS — I10 Essential (primary) hypertension: Secondary | ICD-10-CM | POA: Diagnosis not present

## 2022-01-21 NOTE — ED Triage Notes (Signed)
HTN ongoing and being monitored for past 2 years. Worsened over past 2 months. Increased stress levels at home. Pt reports average SBP 150-170 at home. Recent check 200/130 prior to coming to ED. Pt has intermittent headaches in the morning that go away after getting up and moving around. Pt denies HA, SOB, weakness, fatigue, dizziness/lightheadedness, CP at this time. Not prescribed any medications for HTN treatment at this time.

## 2022-01-21 NOTE — ED Provider Notes (Signed)
Emergency Department Provider Note   I have reviewed the triage vital signs and the nursing notes.   HISTORY  Chief Complaint Hypertension   HPI Grant Duffy is a 56 y.o. male with PMH reviewed presents to the ED with elevated BP reading at home.  Patient has no prior history of hypertension.  He states that he has been checking his blood pressure after some borderline readings over the past couple of weeks.  He had a primary care physician appointment 2 weeks ago with elevated BP. Plan was made to record BP at home and follow up this coming Monday to review records and decide if starting medication was indicated.  Patient notes fairly significant stress levels recently including today.  He checked his blood pressure at home during a stressful time and found it to be in the 683M systolic range.  He checked it again later in the evening, again in a stressful situation, and found it to be greater than 200.  He was not having symptoms such as chest tightness, headache, numbness/weakness, fatigue, vision changes.  With increased BP he presents for further evaluation.   Past Medical History:  Diagnosis Date   Elevated cholesterol    "diet controlled" (03/15/2015)   NSAID-induced gastric ulcer 2013   PONV (postoperative nausea and vomiting)     Review of Systems  Constitutional: No fever/chills Eyes: No visual changes. ENT: No sore throat. Cardiovascular: Denies chest pain. Positive elevated BP.  Respiratory: Denies shortness of breath. Gastrointestinal: No abdominal pain.  Musculoskeletal: Negative for back pain. Skin: Negative for rash. Neurological: Negative for headaches, focal weakness or numbness.  ____________________________________________   PHYSICAL EXAM:  VITAL SIGNS: ED Triage Vitals  Enc Vitals Group     BP 01/21/22 2327 (!) 195/130     Pulse Rate 01/21/22 2327 73     Resp 01/21/22 2327 15     Temp 01/21/22 2327 97.7 F (36.5 C)     Temp Source 01/21/22  2327 Oral     SpO2 01/21/22 2327 97 %     Weight 01/21/22 2328 200 lb (90.7 kg)     Height 01/21/22 2328 5\' 10"  (1.778 m)   Constitutional: Alert and oriented. Well appearing and in no acute distress. Eyes: Conjunctivae are normal. PERRL. EOMI. Head: Atraumatic. Nose: No congestion/rhinnorhea. Mouth/Throat: Mucous membranes are moist.  Neck: No stridor.   Cardiovascular: Normal rate, regular rhythm. Good peripheral circulation. Grossly normal heart sounds.   Respiratory: Normal respiratory effort.  No retractions. Lungs CTAB. Gastrointestinal: Soft and nontender. No distention.  Musculoskeletal: No lower extremity tenderness nor edema. No gross deformities of extremities. Neurologic:  Normal speech and language. No gross focal neurologic deficits are appreciated.  Skin:  Skin is warm, dry and intact. No rash noted.  ____________________________________________   LABS (all labs ordered are listed, but only abnormal results are displayed)  Labs Reviewed  BASIC METABOLIC PANEL - Abnormal; Notable for the following components:      Result Value   Glucose, Bld 101 (*)    BUN 23 (*)    All other components within normal limits  CBC WITH DIFFERENTIAL/PLATELET  TROPONIN I (HIGH SENSITIVITY)  TROPONIN I (HIGH SENSITIVITY)   ____________________________________________  EKG   EKG Interpretation  Date/Time:  Saturday January 21 2022 23:52:17 EDT Ventricular Rate:  77 PR Interval:  171 QRS Duration: 100 QT Interval:  368 QTC Calculation: 417 R Axis:   87 Text Interpretation: Sinus rhythm Confirmed by Nanda Quinton [phone]) on 01/22/2022 1:19:38  AM        ____________________________________________   PROCEDURES  Procedure(s) performed:   Procedures  None  ____________________________________________   INITIAL IMPRESSION / ASSESSMENT AND PLAN / ED COURSE  Pertinent labs & imaging results that were available during my care of the patient were reviewed by me and  considered in my medical decision making (see chart for details).   This patient is Presenting for Evaluation of elevated BP, which does require a range of treatment options, and is a complaint that involves a high risk of morbidity and mortality.  The Differential Diagnoses include HTN emergency, ACS, CVA, AKI, etc.   I did obtain Additional Historical Information from wife at bedside.  I decided to review pertinent External Data, and in summary patient's PCP notes not available in our system.    Clinical Laboratory Tests Ordered, included BMP without AKI or electrolyte disturbance. No anemia or leukocytosis. Troponin negative.   Cardiac Monitor Tracing which shows NSR without ectopy.   Medical Decision Making: Summary:  Patient presents to the emergency department with elevated blood pressures at home.  No symptoms but reports significant stress.  Blood pressure here initially elevated but downtrending with no intervention.  No findings concerning for endorgan damage or hypertensive emergency.  Discussed either starting a low-dose BP med from the ED versus calling PCP on Monday.  On reassessment, patient is doing well.  Blood pressure improved slightly.  Plan for continued BP monitoring and PCP follow-up by phone Monday. Hold on starting new meds now.   Considered admission but no end organ damage or other acute findings. Plan for close PCP follow up.   Disposition: discharge  ____________________________________________  FINAL CLINICAL IMPRESSION(S) / ED DIAGNOSES  Final diagnoses:  Primary hypertension    Note:  This document was prepared using Dragon voice recognition software and may include unintentional dictation errors.  Alona Bene, MD, [facility] Emergency Medicine    Koralee Wedeking, Arlyss Repress, MD 01/22/22 [phone]

## 2022-01-22 LAB — CBC WITH DIFFERENTIAL/PLATELET
Abs Immature Granulocytes: 0.03 10*3/uL (ref 0.00–0.07)
Basophils Absolute: 0.1 10*3/uL (ref 0.0–0.1)
Basophils Relative: 1 %
Eosinophils Absolute: 0.1 10*3/uL (ref 0.0–0.5)
Eosinophils Relative: 2 %
HCT: 46.8 % (ref 39.0–52.0)
Hemoglobin: 16.3 g/dL (ref 13.0–17.0)
Immature Granulocytes: 0 %
Lymphocytes Relative: 31 %
Lymphs Abs: 2.9 10*3/uL (ref 0.7–4.0)
MCH: 29 pg (ref 26.0–34.0)
MCHC: 34.8 g/dL (ref 30.0–36.0)
MCV: 83.3 fL (ref 80.0–100.0)
Monocytes Absolute: 0.9 10*3/uL (ref 0.1–1.0)
Monocytes Relative: 10 %
Neutro Abs: 5.2 10*3/uL (ref 1.7–7.7)
Neutrophils Relative %: 56 %
Platelets: 236 10*3/uL (ref 150–400)
RBC: 5.62 MIL/uL (ref 4.22–5.81)
RDW: 12.4 % (ref 11.5–15.5)
WBC: 9.3 10*3/uL (ref 4.0–10.5)
nRBC: 0 % (ref 0.0–0.2)

## 2022-01-22 LAB — BASIC METABOLIC PANEL
Anion gap: 8 (ref 5–15)
BUN: 23 mg/dL — ABNORMAL HIGH (ref 6–20)
CO2: 24 mmol/L (ref 22–32)
Calcium: 9.3 mg/dL (ref 8.9–10.3)
Chloride: 106 mmol/L (ref 98–111)
Creatinine, Ser: 0.98 mg/dL (ref 0.61–1.24)
GFR, Estimated: >60 mL/min (ref >60)
Glucose, Bld: 101 mg/dL — ABNORMAL HIGH (ref 70–99)
Potassium: 4.1 mmol/L (ref 3.5–5.1)
Sodium: 138 mmol/L (ref 135–145)

## 2022-01-22 LAB — TROPONIN I (HIGH SENSITIVITY): Troponin I (High Sensitivity): 7 ng/L (ref <18)

## 2022-01-22 NOTE — Discharge Instructions (Signed)
Please continue taking your blood pressure regularly at home.  I would like for you to call your primary doctor on Monday to discuss your ED visit and home blood pressure readings to decide if starting a medicine is appropriate.  I have included some information on this paperwork regarding a low-sodium diet which will help with your blood pressure as well as other information regarding high blood pressure.

## 2022-02-02 DIAGNOSIS — I10 Essential (primary) hypertension: Secondary | ICD-10-CM | POA: Diagnosis not present

## 2022-06-07 DIAGNOSIS — H40003 Preglaucoma, unspecified, bilateral: Secondary | ICD-10-CM | POA: Diagnosis not present

## 2022-08-16 DIAGNOSIS — H40003 Preglaucoma, unspecified, bilateral: Secondary | ICD-10-CM | POA: Diagnosis not present

## 2022-08-26 IMAGING — DX DG HIP (WITH OR WITHOUT PELVIS) 2-3V*L*
2 series · 2 of 2 positions shown · non-contrast
Comparison: None.

CLINICAL DATA: Left hip pain for 1 year

EXAM:
DG HIP (WITH OR WITHOUT PELVIS) 2-3V LEFT

[dg hip unilat w or w/o pelvis 2-3 views  (1 of 2)]
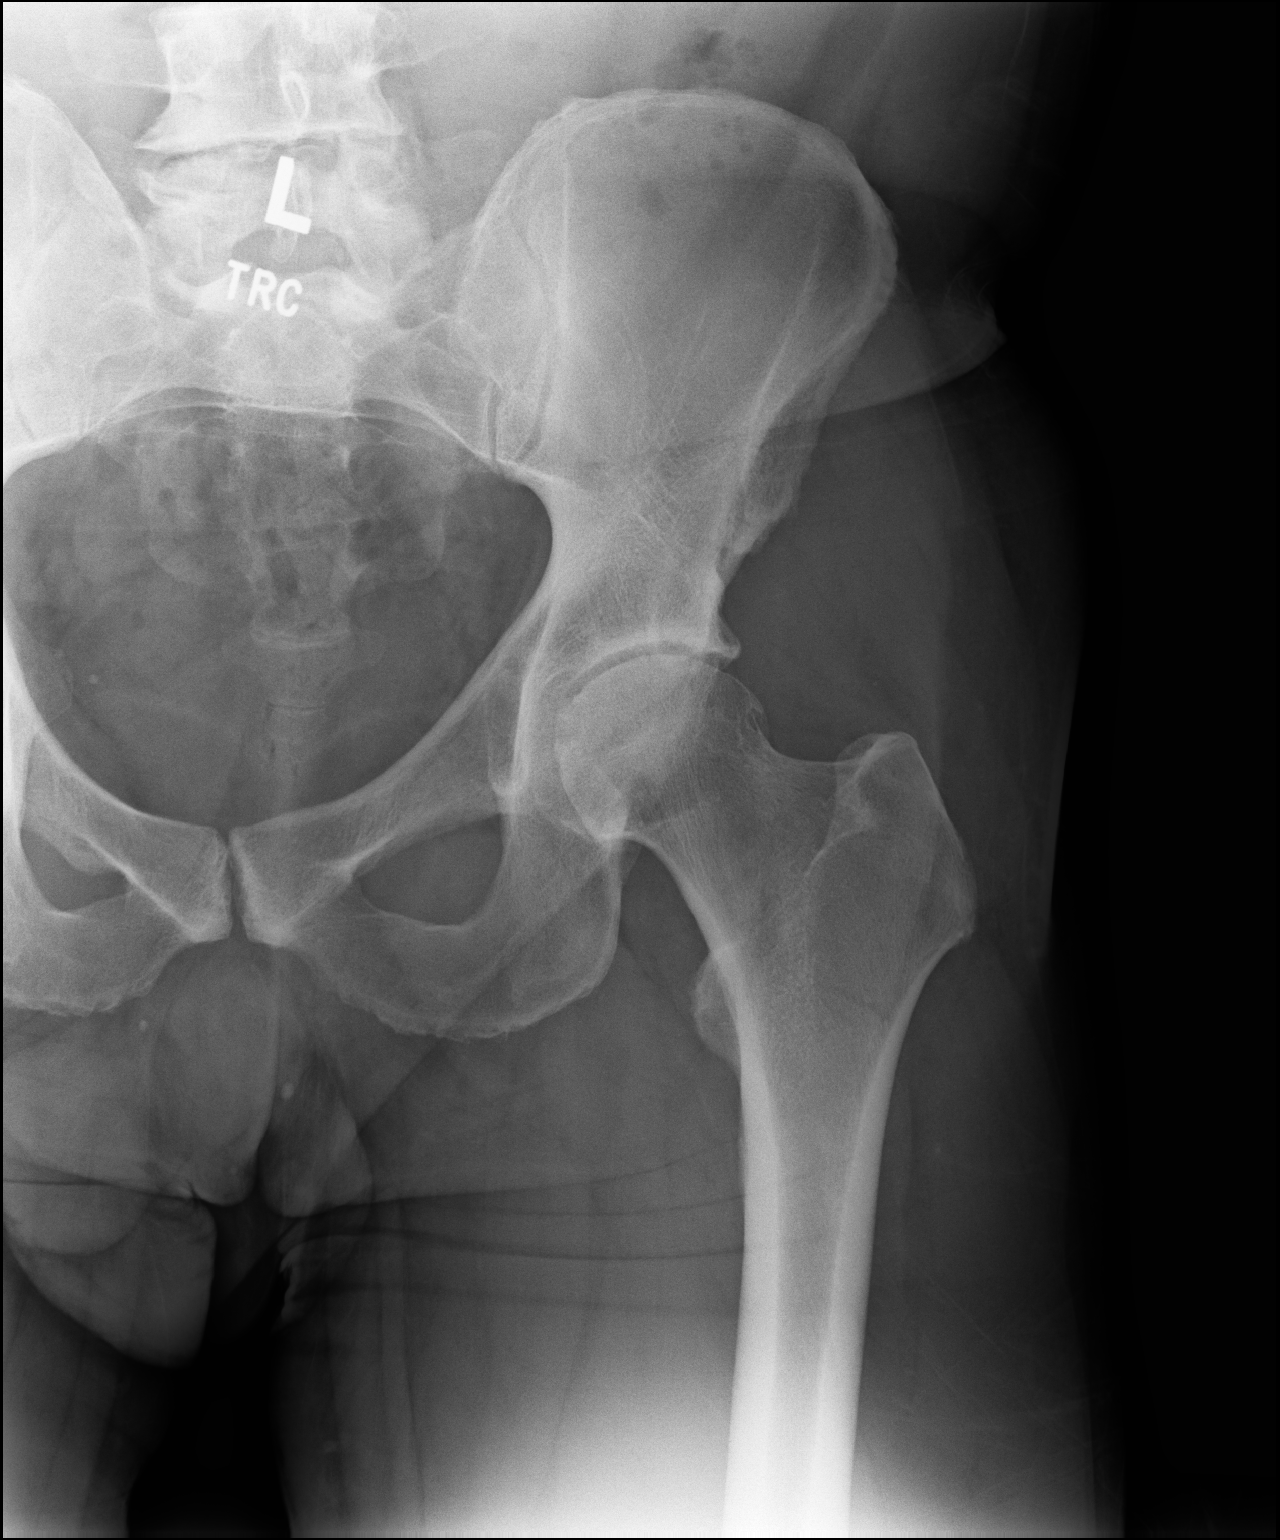

[dg hip unilat w or w/o pelvis 2-3 views  (2 of 2)]
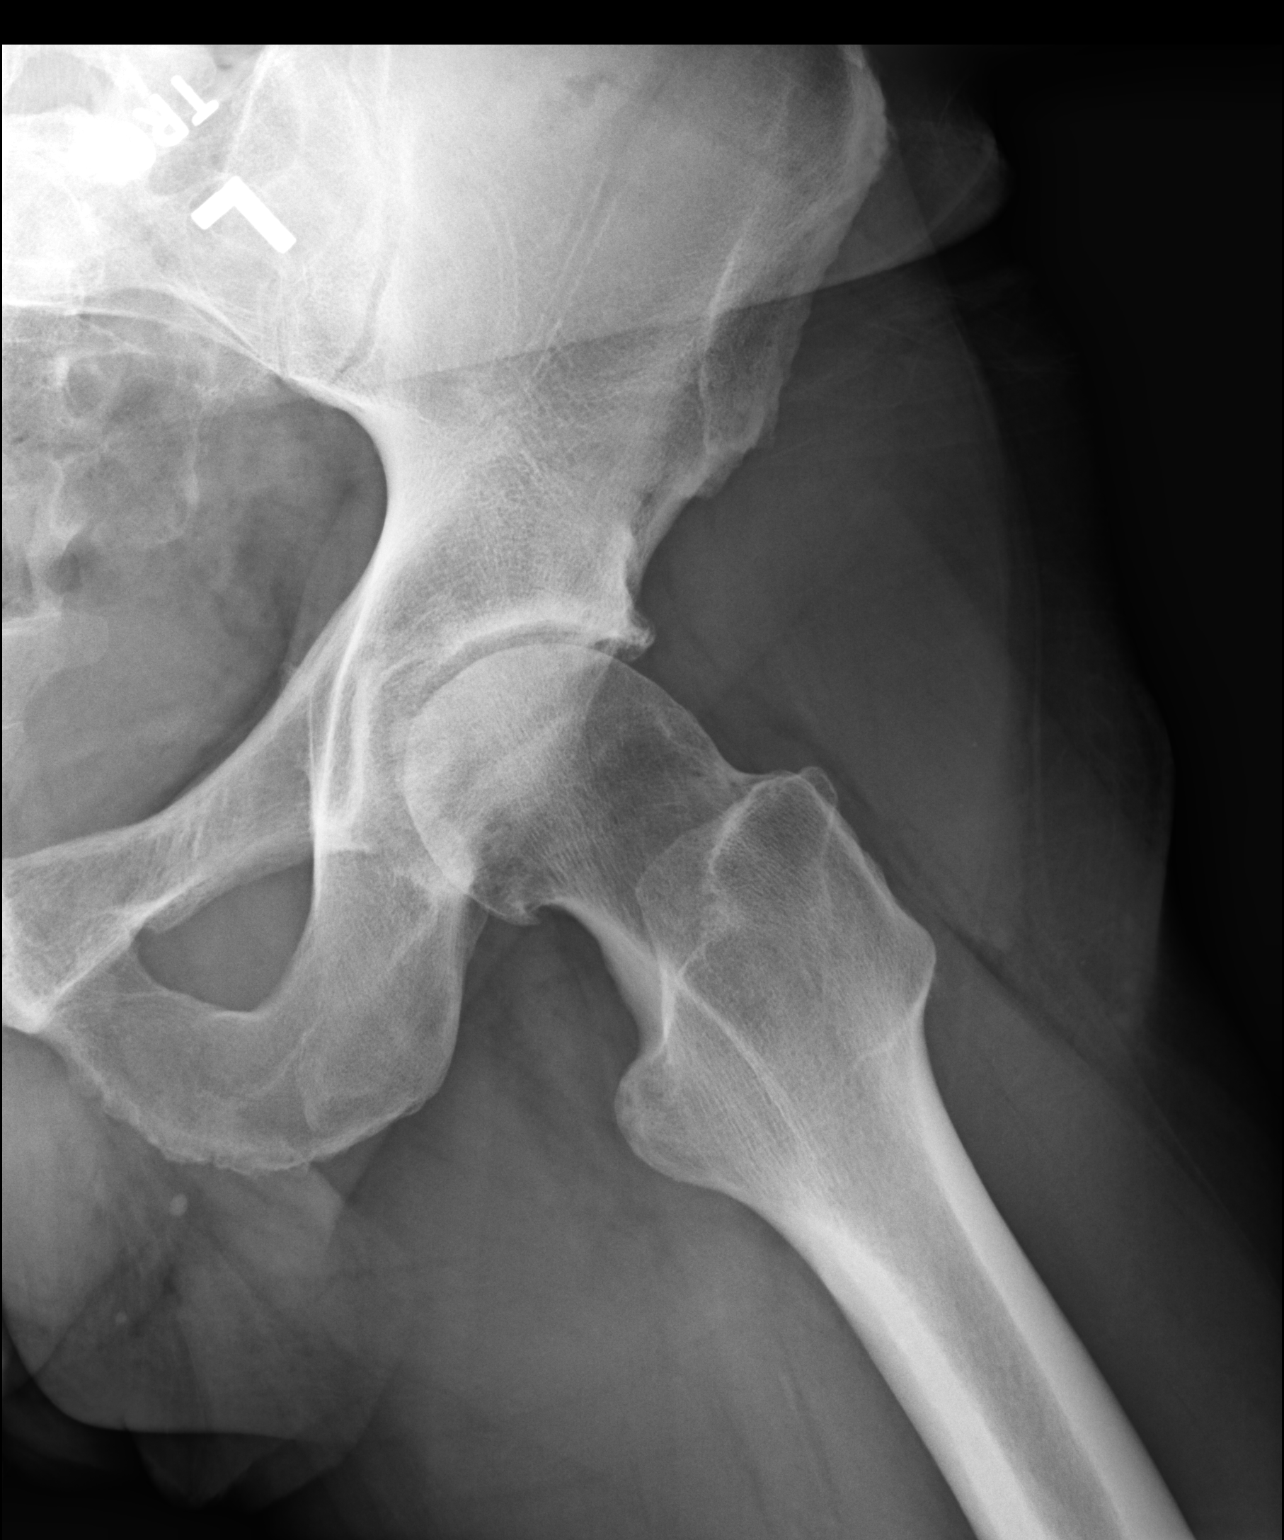

[2 of 2 positions shown; findings below may reference images not displayed]

FINDINGS: Frontal and frogleg lateral views of the left hip demonstrate
moderate to severe left hip osteoarthritis with joint space
narrowing and osteophyte formation. No fracture, subluxation, or
dislocation. There is prominent spondylosis seen within the lower
lumbar spine. Sacroiliac joints appear normal.
IMPRESSION: 1. Moderate to severe left hip osteoarthritis.
2. Significant lower lumbar spondylosis.

## 2022-11-01 DIAGNOSIS — L814 Other melanin hyperpigmentation: Secondary | ICD-10-CM | POA: Diagnosis not present

## 2022-11-01 DIAGNOSIS — Z08 Encounter for follow-up examination after completed treatment for malignant neoplasm: Secondary | ICD-10-CM | POA: Diagnosis not present

## 2022-11-01 DIAGNOSIS — D225 Melanocytic nevi of trunk: Secondary | ICD-10-CM | POA: Diagnosis not present

## 2022-11-01 DIAGNOSIS — L821 Other seborrheic keratosis: Secondary | ICD-10-CM | POA: Diagnosis not present

## 2023-01-05 DIAGNOSIS — Z23 Encounter for immunization: Secondary | ICD-10-CM | POA: Diagnosis not present

## 2023-01-05 DIAGNOSIS — Z0000 Encounter for general adult medical examination without abnormal findings: Secondary | ICD-10-CM | POA: Diagnosis not present

## 2023-01-11 DIAGNOSIS — Z131 Encounter for screening for diabetes mellitus: Secondary | ICD-10-CM | POA: Diagnosis not present

## 2023-01-11 DIAGNOSIS — Z13220 Encounter for screening for lipoid disorders: Secondary | ICD-10-CM | POA: Diagnosis not present

## 2023-01-11 DIAGNOSIS — Z125 Encounter for screening for malignant neoplasm of prostate: Secondary | ICD-10-CM | POA: Diagnosis not present

## 2023-01-11 DIAGNOSIS — I10 Essential (primary) hypertension: Secondary | ICD-10-CM | POA: Diagnosis not present
